# Patient Record
Sex: Male | Born: 2000 | Race: White | Hispanic: No | Marital: Single | Smoking: Never smoker
Health system: Southern US, Community
[De-identification: ages and names within clinical notes are randomized; demographics above are authoritative.]

## PROBLEM LIST (undated history)

## (undated) DIAGNOSIS — R569 Unspecified convulsions: Secondary | ICD-10-CM

## (undated) DIAGNOSIS — R011 Cardiac murmur, unspecified: Secondary | ICD-10-CM

## (undated) DIAGNOSIS — F909 Attention-deficit hyperactivity disorder, unspecified type: Secondary | ICD-10-CM

## (undated) DIAGNOSIS — M419 Scoliosis, unspecified: Secondary | ICD-10-CM

## (undated) HISTORY — PX: TONSILLECTOMY: SUR1361

---
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
## 2018-01-24 IMAGING — CR DG CHEST 2V
1 series · 2 of 2 positions shown · non-contrast
Comparison: None.

CLINICAL DATA: Intermittent nausea and epigastric pain, onset
yesterday

EXAM:
CHEST  2 VIEW

[Series 1: dg chest 2 view · 0.14mm/px · 2 of 2 slices shown]
[im 1/2]
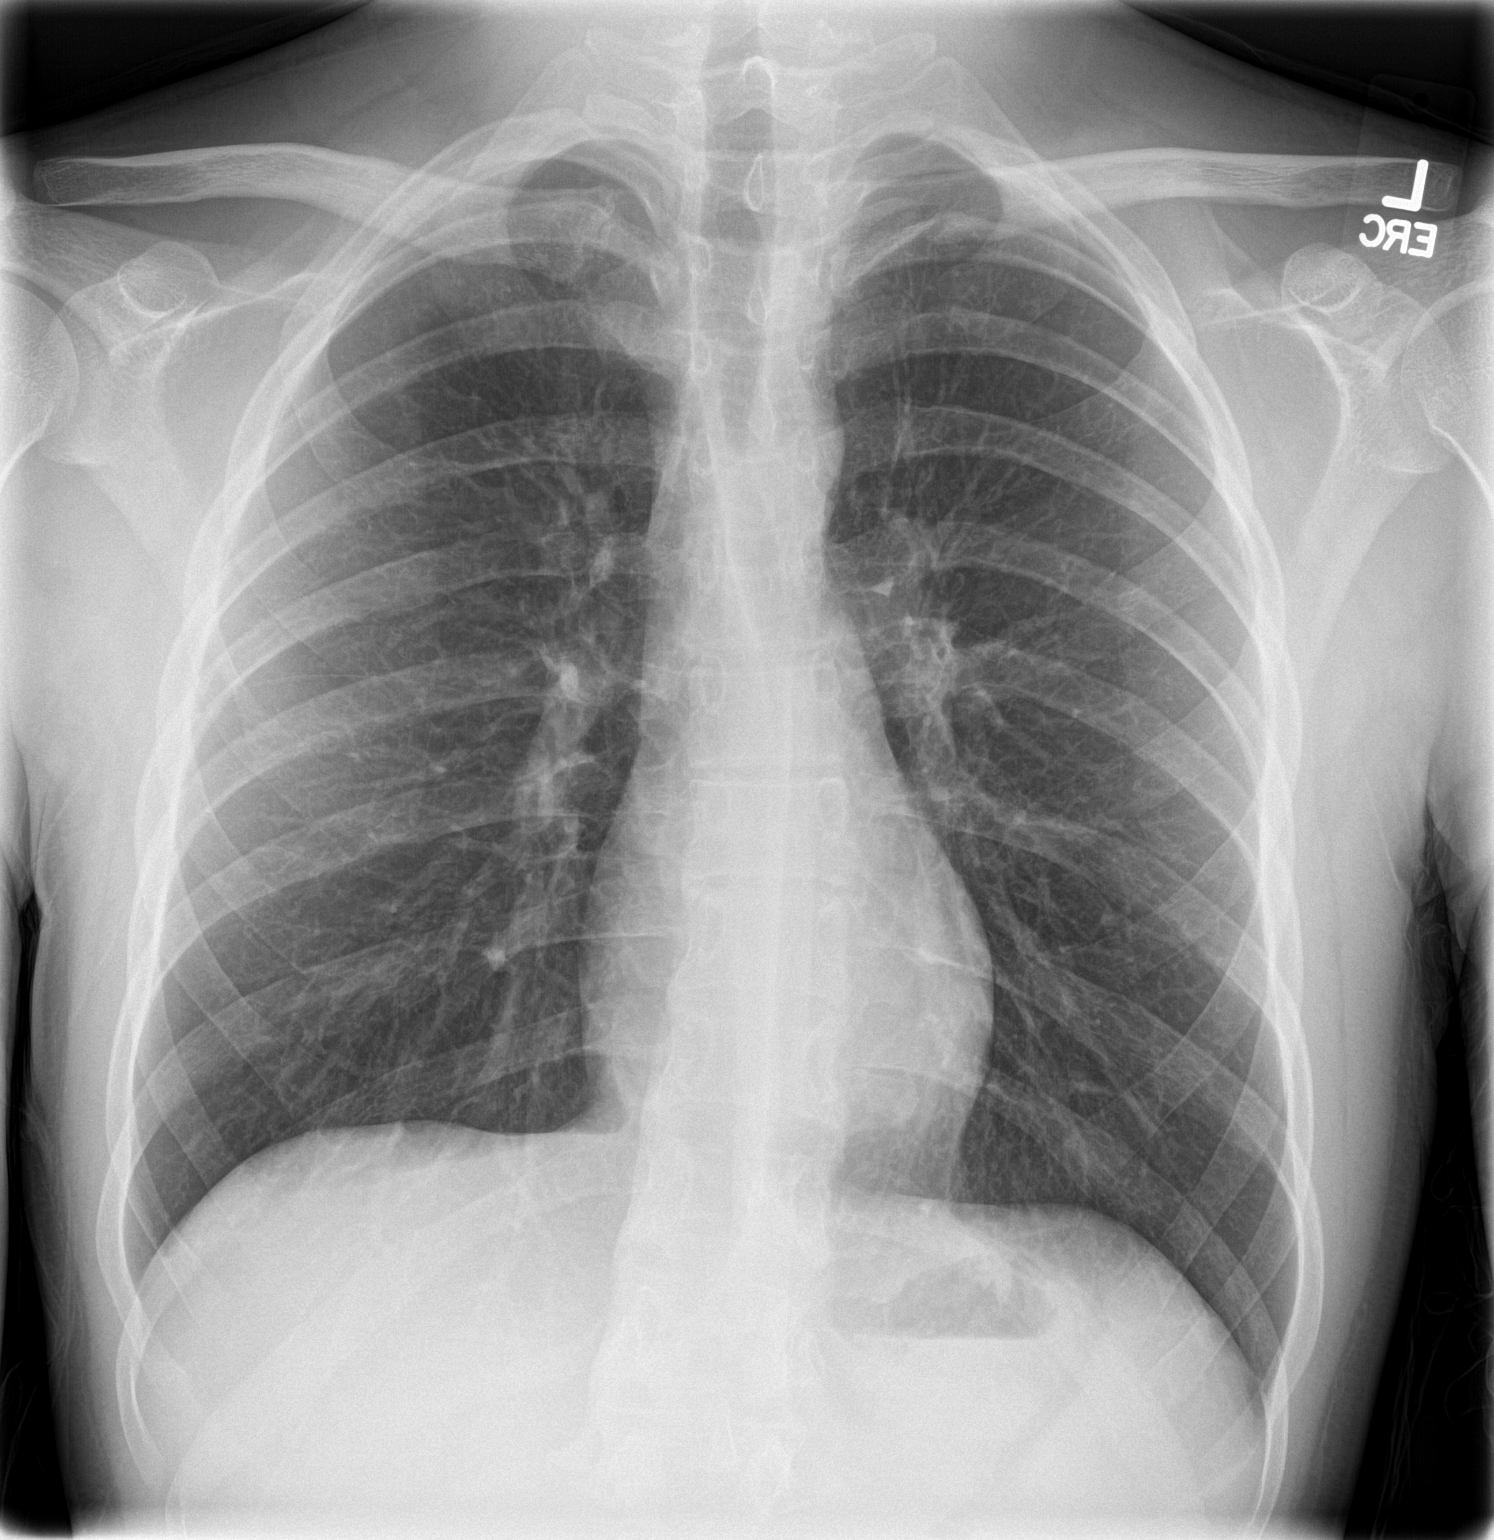
[im 2/2]
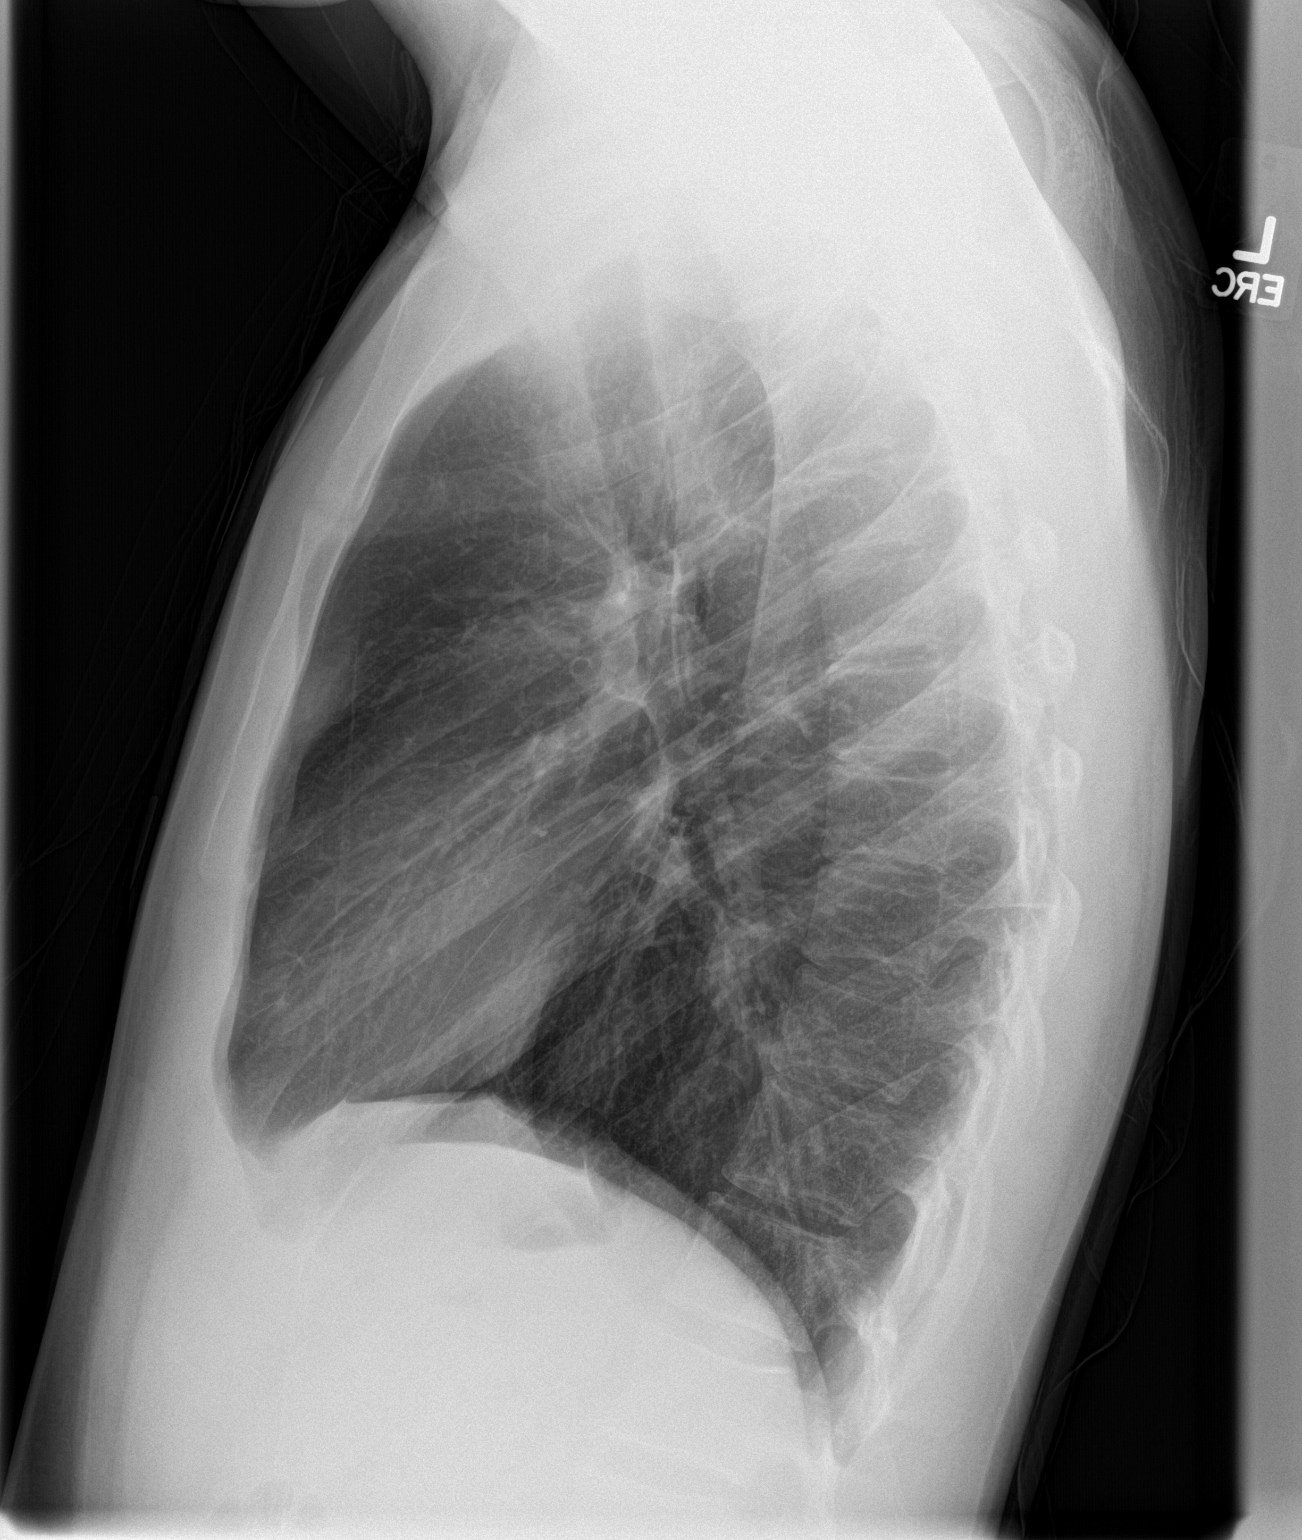

[2 of 2 positions shown; findings below may reference images not displayed]

FINDINGS: The heart size and mediastinal contours are within normal limits.
Both lungs are clear. The visualized skeletal structures are
unremarkable.
IMPRESSION: No active cardiopulmonary disease.

## 2023-07-15 ENCOUNTER — Other Ambulatory Visit: Payer: Self-pay

## 2023-07-15 ENCOUNTER — Encounter: Payer: Self-pay | Admitting: Emergency Medicine

## 2023-07-15 ENCOUNTER — Emergency Department
Admission: EM | Admit: 2023-07-15 | Discharge: 2023-07-15 | Disposition: A | Discharge status: Home / Self Care | Payer: Medicaid Other | Attending: Emergency Medicine | Admitting: Emergency Medicine

## 2023-07-15 DIAGNOSIS — H6690 Otitis media, unspecified, unspecified ear: Secondary | ICD-10-CM

## 2023-07-15 DIAGNOSIS — H9202 Otalgia, left ear: Secondary | ICD-10-CM | POA: Diagnosis present

## 2023-07-15 DIAGNOSIS — H6692 Otitis media, unspecified, left ear: Secondary | ICD-10-CM | POA: Insufficient documentation

## 2023-07-15 MED ORDER — ONDANSETRON 4 MG PO TBDP: 4.0000 mg | ORAL_TABLET | Freq: Three times a day (TID) | ORAL | 0 refills | Status: DC | PRN

## 2023-07-15 MED ORDER — AMOXICILLIN 875 MG PO TABS: 875.0000 mg | ORAL_TABLET | Freq: Two times a day (BID) | ORAL | 0 refills | Status: DC

## 2023-07-15 MED ORDER — AMOXICILLIN 875 MG PO TABS: 875.0000 mg | ORAL_TABLET | Freq: Two times a day (BID) | ORAL | 0 refills | Status: AC

## 2023-07-15 MED ORDER — IBUPROFEN 600 MG PO TABS: 600.0000 mg | ORAL_TABLET | Freq: Four times a day (QID) | ORAL | 0 refills | Status: DC | PRN

## 2023-07-15 MED ORDER — ONDANSETRON 4 MG PO TBDP: 4.0000 mg | ORAL_TABLET | Freq: Three times a day (TID) | ORAL | 0 refills | Status: AC | PRN

## 2023-07-15 MED ORDER — IBUPROFEN 600 MG PO TABS: 600.0000 mg | ORAL_TABLET | Freq: Four times a day (QID) | ORAL | 0 refills | Status: AC | PRN

## 2023-07-15 NOTE — ED Triage Notes (Signed)
Patient to ED via POV for left ear pain x2 days. States he has had blood coming out of ear x2 days. States N/V when in pain. Hx of tubes.

## 2023-07-15 NOTE — Discharge Instructions (Signed)
Take antibiotics Tylenol 1g every 8 hours and ibuprofen with food for one week Call ENT to make follow up. Return for any other concerns.

## 2023-07-15 NOTE — ED Provider Notes (Signed)
[facility] Provider Note    Event Date/Time   First MD Initiated Contact with Patient 07/15/23 1226     (approximate)   History   Otalgia   HPI  Randall Pena is a 22 y.o. male  otherwise healthy comes in with L ear pain for 2-3 days. Subjective fever. Prior Ear tubes- no longer has them.  He reports that sometimes the pain gets so bad that he vomits.  Physical Exam   Triage Vital Signs: ED Triage Vitals  Encounter Vitals Group     BP 07/15/23 1223 (!) 123/100     Systolic BP Percentile --      Diastolic BP Percentile --      Pulse Rate 07/15/23 1223 72     Resp 07/15/23 1223 18     Temp 07/15/23 1223 99.8 F (37.7 C)     Temp Source 07/15/23 1223 Oral     SpO2 07/15/23 1223 99 %     Weight 07/15/23 1222 180 lb (81.6 kg)     Height 07/15/23 1222 5\' 5"  (1.651 m)     Head Circumference --      Peak Flow --      Pain Score 07/15/23 1222 10     Pain Loc --      Pain Education --      Exclude from Growth Chart --     Most recent vital signs: Vitals:   07/15/23 1223  BP: (!) 123/100  Pulse: 72  Resp: 18  Temp: 99.8 F (37.7 C)  SpO2: 99%     General: Awake, no distress.  CV:  Good peripheral perfusion.  Resp:  Normal effort.  Abd:  No distention.  Other:  TM on the right appears normal.  T-ball the left does not have any blood coming out of it.  There is a lot of bubbles in there but he reports putting hydrogen peroxide in there prior to coming in.  There is some erythema but difficult to see completely with the hydrogen peroxide bubbles.  He has no mastoid effusion or significant redness or tenderness on his auricula   ED Results / Procedures / Treatments   Labs (all labs ordered are listed, but only abnormal results are displayed) Labs Reviewed - No data to display   EKG  My interpretation of EKG:    RADIOLOGY I have reviewed the xray personally and agree with radiology read   PROCEDURES:  Critical Care performed:  No  Procedures   MEDICATIONS ORDERED IN ED: Medications - No data to display   IMPRESSION / MDM / ASSESSMENT AND PLAN / ED COURSE  I reviewed the triage vital signs and the nursing notes.   Patient's presentation is most consistent with acute, uncomplicated illness.   Patient comes in with left ear pain.  Discussed with patient's mom who does report he has a history of an ear tube that was removed secondary to infection and that has a chronic hole there.  Difficult to fully appreciate the tympanic membrane given there is a lot of peroxide bubbles and there but given patient's pain and low-grade fevers will start on amoxicillin.  Did confirm with patient's mom that he has had this before he is not allergic to it given his allergy to ceftriaxone.  Will prescribe some ibuprofen, Zofran to help with his other symptoms.     FINAL CLINICAL IMPRESSION(S) / ED DIAGNOSES   Final diagnoses:  Acute otitis media, unspecified otitis media type  Rx / DC Orders   ED Discharge Orders          Ordered    amoxicillin (AMOXIL) 875 MG tablet  2 times daily,   Status:  Discontinued        07/15/23 1231    ibuprofen (ADVIL) 600 MG tablet  Every 6 hours PRN,   Status:  Discontinued        07/15/23 1231    ondansetron (ZOFRAN-ODT) 4 MG disintegrating tablet  Every 8 hours PRN,   Status:  Discontinued        07/15/23 1231    amoxicillin (AMOXIL) 875 MG tablet  2 times daily        07/15/23 1233    ibuprofen (ADVIL) 600 MG tablet  Every 6 hours PRN        07/15/23 1233    ondansetron (ZOFRAN-ODT) 4 MG disintegrating tablet  Every 8 hours PRN        07/15/23 1233             Note:  This document was prepared using Dragon voice recognition software and may include unintentional dictation errors.   Concha Se, MD 07/15/23 [phone]
